# Patient Record
Sex: Female | Born: 1935 | Race: White | Hispanic: No | Marital: Married
Health system: Southern US, Community
[De-identification: ages and names within clinical notes are randomized; demographics above are authoritative.]

---

## 2004-11-30 ENCOUNTER — Ambulatory Visit: Payer: Self-pay | Admitting: Ophthalmology

## 2007-05-06 ENCOUNTER — Ambulatory Visit: Payer: Self-pay | Admitting: Internal Medicine

## 2007-05-12 ENCOUNTER — Ambulatory Visit: Payer: Self-pay | Admitting: Internal Medicine

## 2007-06-11 ENCOUNTER — Ambulatory Visit: Payer: Self-pay | Admitting: Internal Medicine

## 2010-03-16 ENCOUNTER — Ambulatory Visit: Payer: Self-pay | Admitting: Internal Medicine

## 2013-01-02 ENCOUNTER — Inpatient Hospital Stay: Payer: Self-pay | Admitting: Internal Medicine

## 2013-01-02 LAB — COMPREHENSIVE METABOLIC PANEL
Albumin: 3.6 g/dL (ref 3.4–5.0)
Alkaline Phosphatase: 96 U/L (ref 50–136)
Anion Gap: 11 (ref 7–16)
BUN: 26 mg/dL — ABNORMAL HIGH (ref 7–18)
Bilirubin,Total: 0.4 mg/dL (ref 0.2–1.0)
Calcium, Total: 9.2 mg/dL (ref 8.5–10.1)
Co2: 24 mmol/L (ref 21–32)
Creatinine: 1.55 mg/dL — ABNORMAL HIGH (ref 0.60–1.30)
EGFR (African American): 33 — ABNORMAL LOW
EGFR (Non-African Amer.): 28 — ABNORMAL LOW
Glucose: 163 mg/dL — ABNORMAL HIGH (ref 65–99)
Osmolality: 288 (ref 275–301)
Potassium: 3.6 mmol/L (ref 3.5–5.1)
SGOT(AST): 22 U/L (ref 15–37)
SGPT (ALT): 18 U/L (ref 12–78)
Sodium: 140 mmol/L (ref 136–145)
Total Protein: 7.3 g/dL (ref 6.4–8.2)

## 2013-01-02 LAB — URINALYSIS, COMPLETE
Bilirubin,UR: NEGATIVE
Blood: NEGATIVE
Glucose,UR: NEGATIVE mg/dL (ref 0–75)
Ketone: NEGATIVE
Leukocyte Esterase: NEGATIVE
Ph: 5 (ref 4.5–8.0)
RBC,UR: 3 /HPF (ref 0–5)
Specific Gravity: 1.021 (ref 1.003–1.030)

## 2013-01-02 LAB — CBC
HGB: 14.1 g/dL (ref 12.0–16.0)
MCH: 28.2 pg (ref 26.0–34.0)
MCHC: 32.2 g/dL (ref 32.0–36.0)
RBC: 5 10*6/uL (ref 3.80–5.20)

## 2013-01-02 LAB — LIPASE, BLOOD: Lipase: 184 U/L (ref 73–393)

## 2013-01-03 LAB — CBC WITH DIFFERENTIAL/PLATELET
Basophil %: 0.4 %
Eosinophil #: 0.1 10*3/uL (ref 0.0–0.7)
Eosinophil %: 0.5 %
HCT: 32.6 % — ABNORMAL LOW (ref 35.0–47.0)
HGB: 10.6 g/dL — ABNORMAL LOW (ref 12.0–16.0)
MCHC: 32.4 g/dL (ref 32.0–36.0)
Neutrophil #: 10.1 10*3/uL — ABNORMAL HIGH (ref 1.4–6.5)

## 2013-01-04 LAB — BASIC METABOLIC PANEL
Anion Gap: 9 (ref 7–16)
BUN: 18 mg/dL (ref 7–18)
Calcium, Total: 7.8 mg/dL — ABNORMAL LOW (ref 8.5–10.1)
Creatinine: 1.51 mg/dL — ABNORMAL HIGH (ref 0.60–1.30)
EGFR (African American): 34 — ABNORMAL LOW
Osmolality: 286 (ref 275–301)
Potassium: 3.5 mmol/L (ref 3.5–5.1)

## 2013-01-04 LAB — CBC WITH DIFFERENTIAL/PLATELET
Eosinophil #: 0.2 10*3/uL (ref 0.0–0.7)
Eosinophil %: 2.1 %
HGB: 9.1 g/dL — ABNORMAL LOW (ref 12.0–16.0)
Lymphocyte #: 1.1 10*3/uL (ref 1.0–3.6)
Lymphocyte %: 13.7 %
MCH: 28.8 pg (ref 26.0–34.0)
MCV: 87 fL (ref 80–100)
Monocyte #: 0.6 x10 3/mm (ref 0.2–0.9)
Monocyte %: 7.8 %
Neutrophil #: 6 10*3/uL (ref 1.4–6.5)
Platelet: 165 10*3/uL (ref 150–440)
RBC: 3.17 10*6/uL — ABNORMAL LOW (ref 3.80–5.20)
RDW: 16.8 % — ABNORMAL HIGH (ref 11.5–14.5)
WBC: 7.9 10*3/uL (ref 3.6–11.0)

## 2014-06-28 IMAGING — CR DG ABDOMEN 3V
1 series · 4 of 4 positions shown · non-contrast
Comparison: none

REASON FOR EXAM: Pain
COMMENTS:   May transport without cardiac monitor

PROCEDURE:     DXR - DXR ABDOMEN 3-WAY (INCL PA CXR)  - January 02, 2013  [DATE]
RESULT:

[Series 1: x chest ap · 0.14mm/px · 4 of 4 slices shown]
[im 1/4]
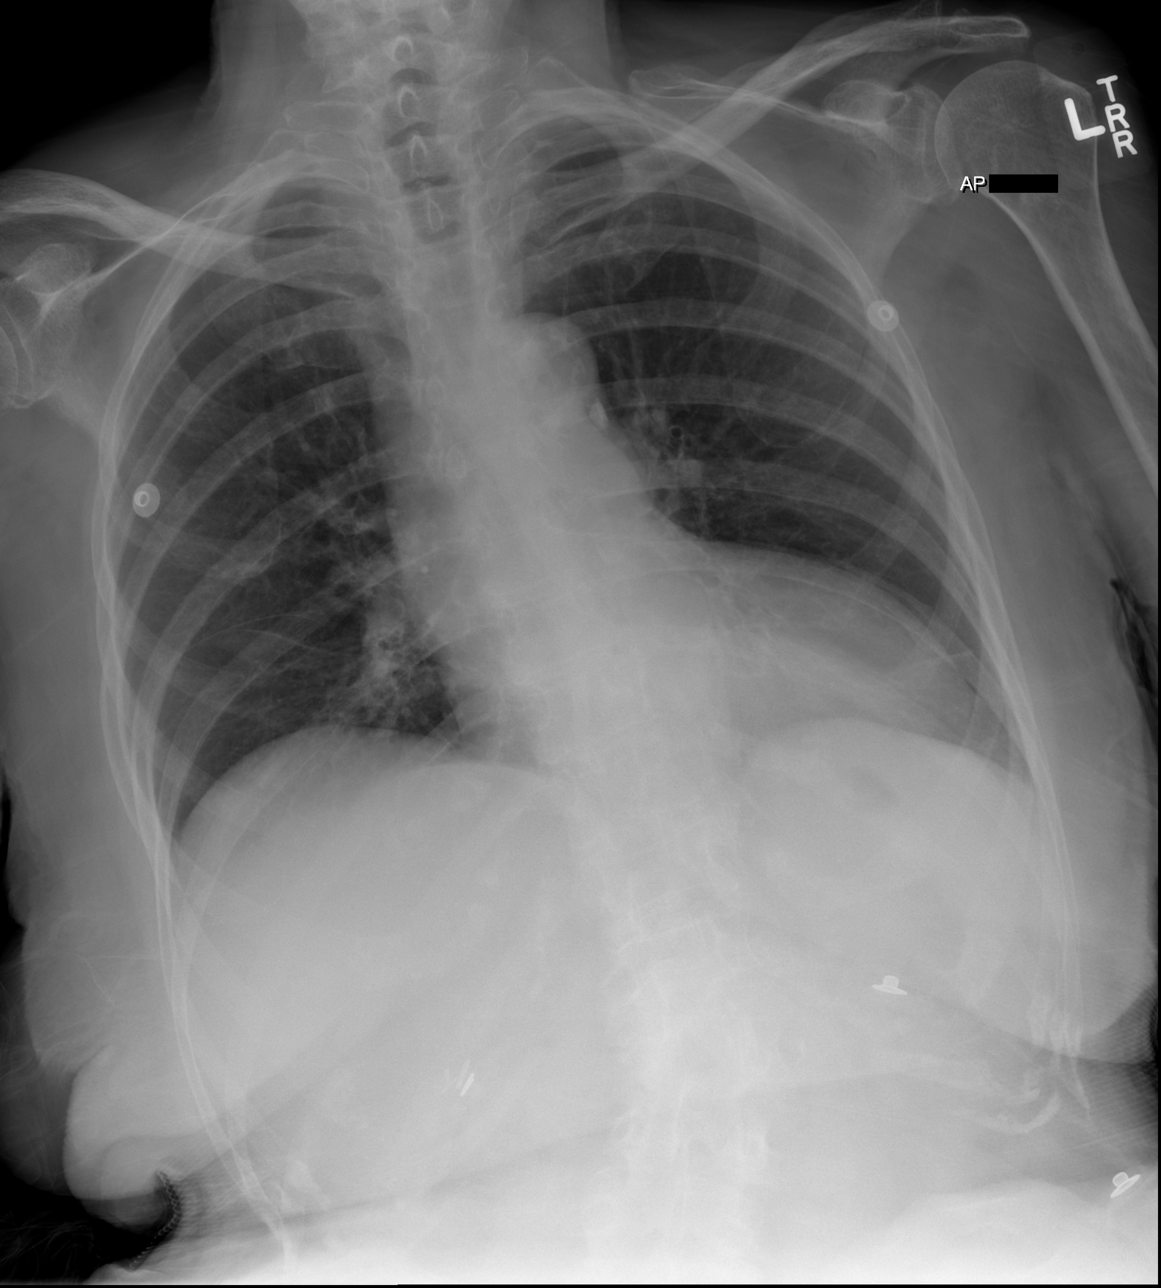
[im 2/4]
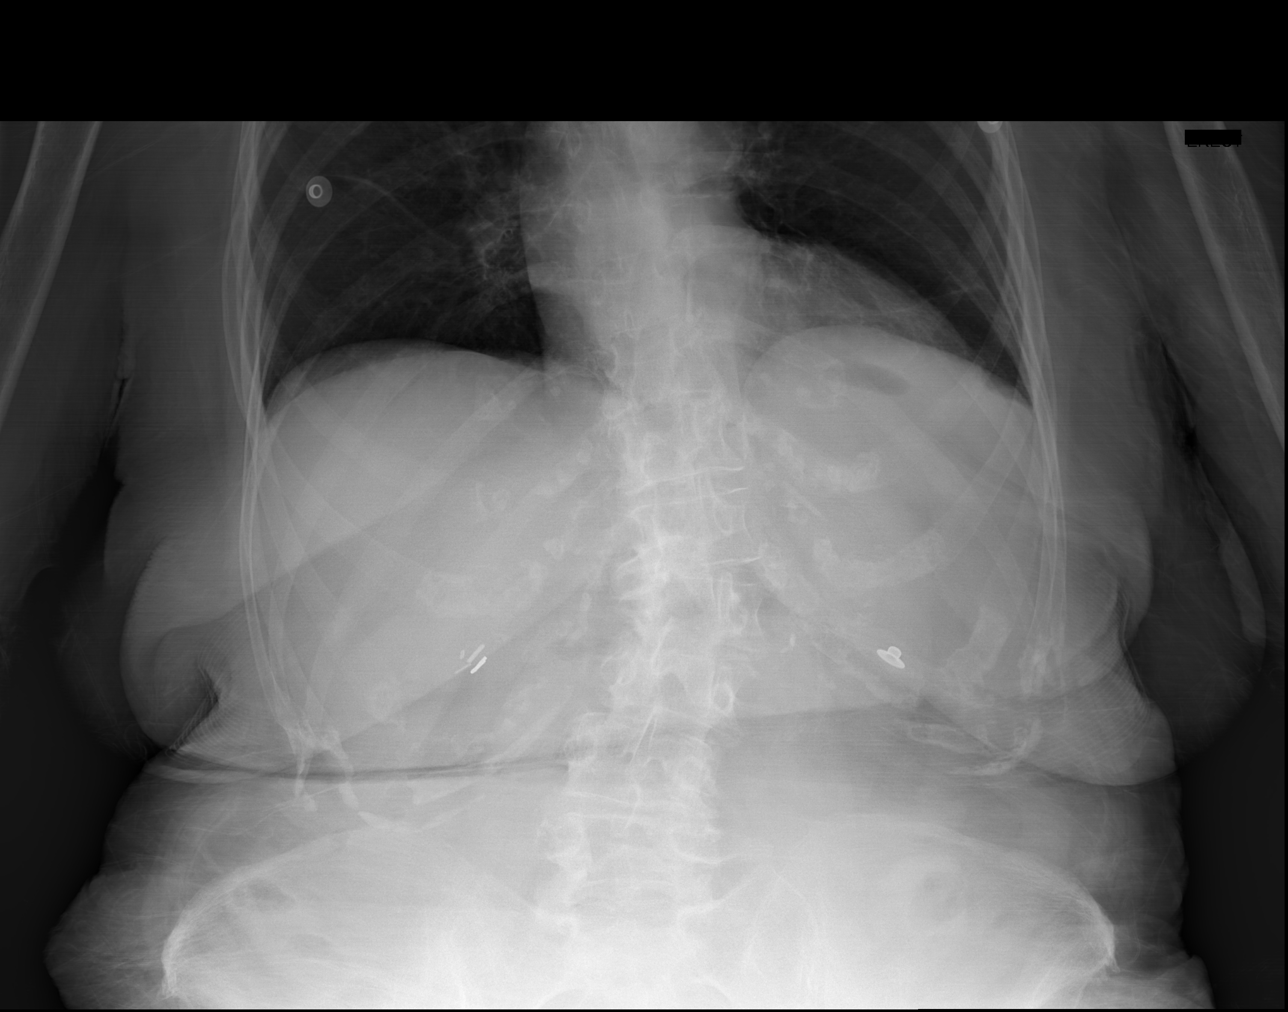
[im 3/4]
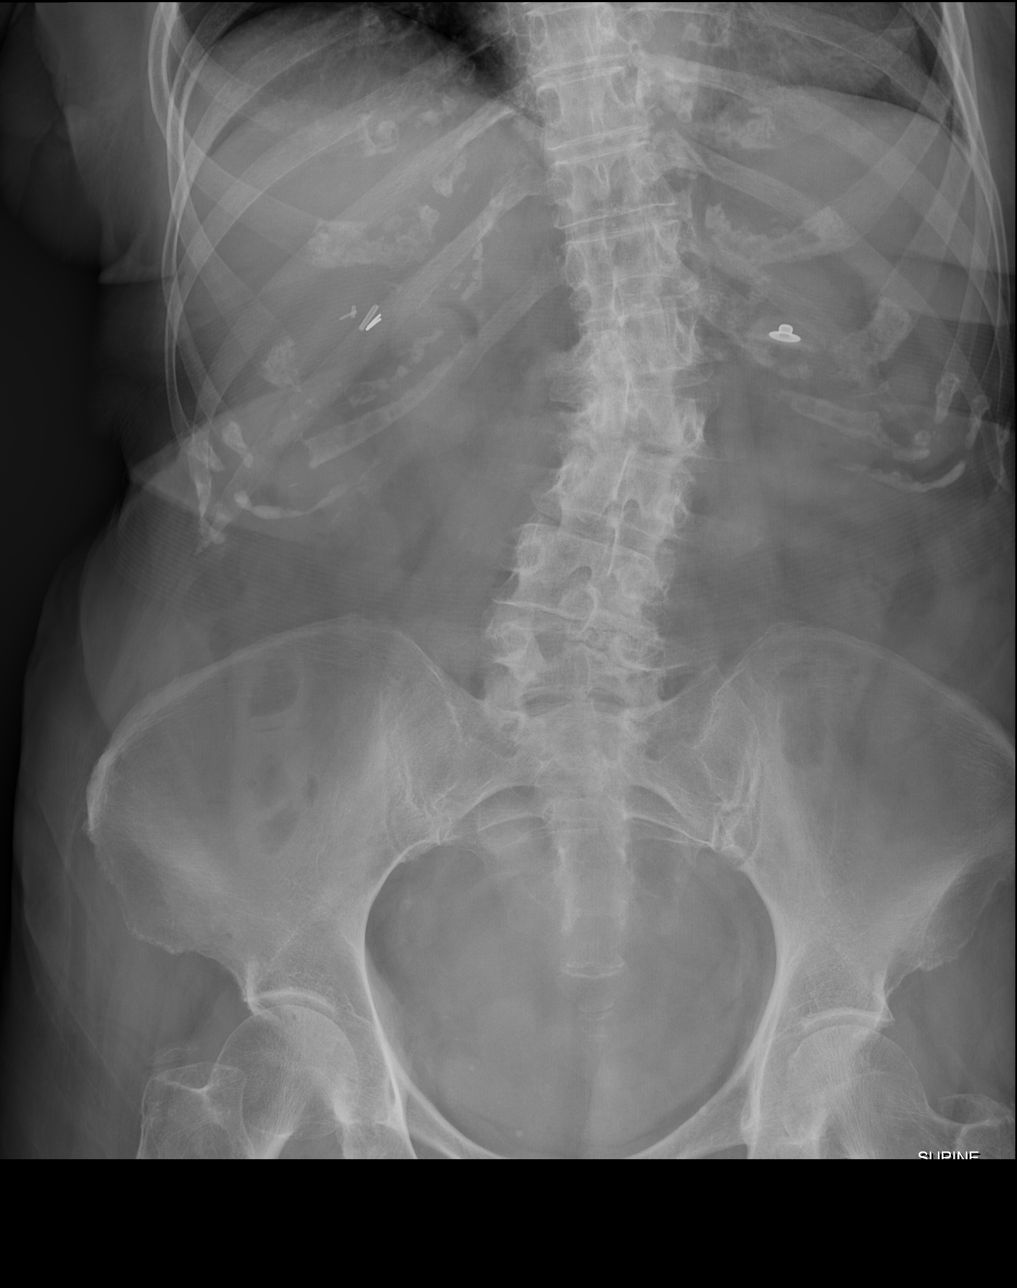
[im 4/4]
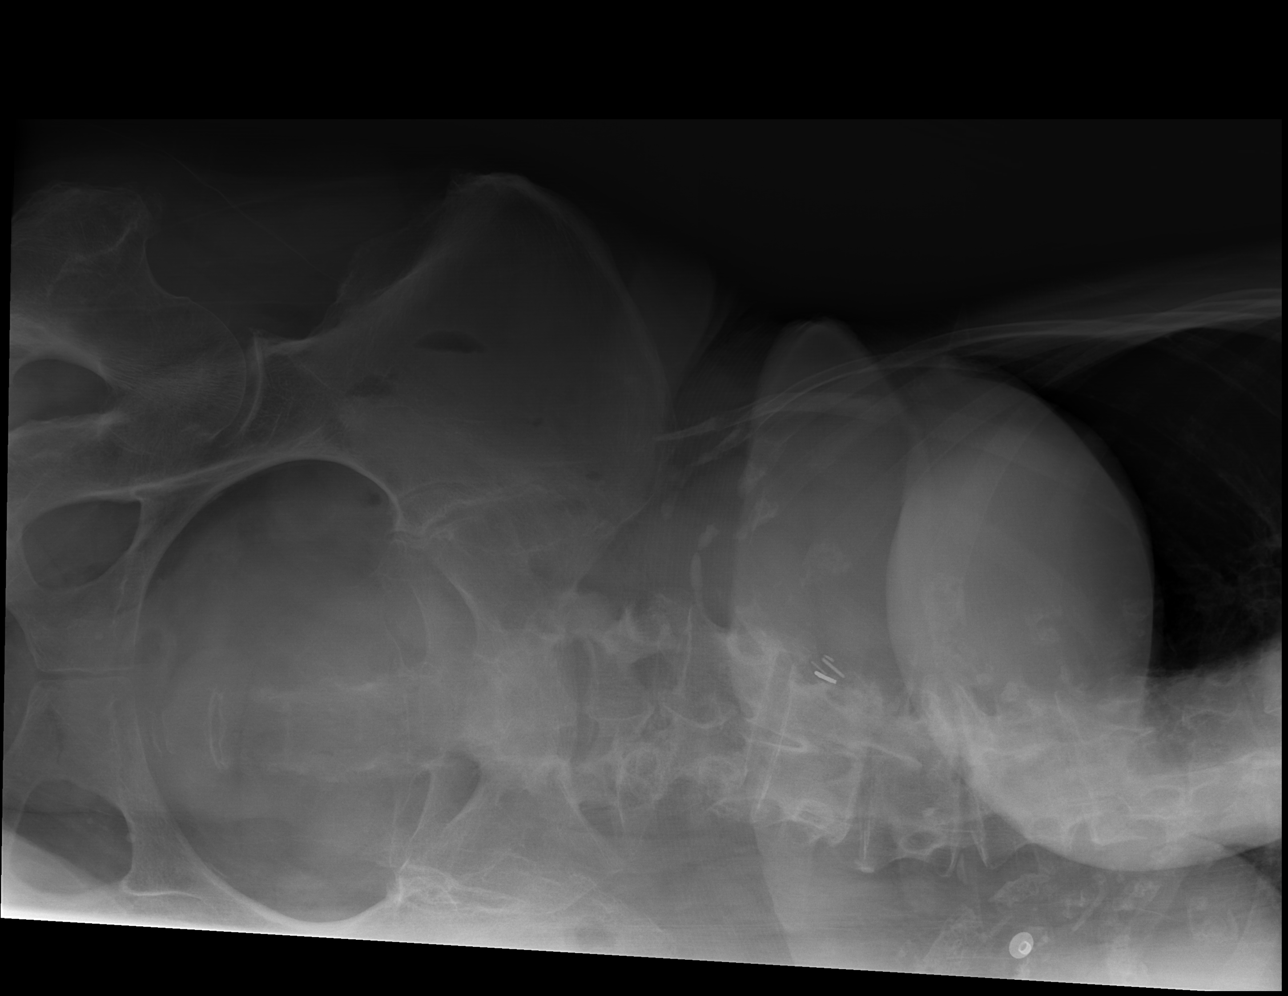

[4 of 4 positions shown; findings below may reference images not displayed]

FINDINGS: Frontal view of the chest demonstrates no evidence of focal
infiltrates, effusions or edema. S-shaped scoliosis is identified within the
thoracolumbar spine.

There is a paucity of bowel gas. There is no evidence of free air.
IMPRESSION: 1.  Nonspecific, nonobstructive bowel gas pattern.
2.  Chest radiograph without evidence of acute cardiopulmonary disease.

## 2014-06-28 IMAGING — CT CT ABD-PELV W/O CM
1 of 2 series · 15 of 32 positions shown, 19 images · non-contrast
Comparison: none

REASON FOR EXAM: (1) vomiting, abdominal pain, leukocytosis; (2)
vomiting, abdominal pain, leukoc
COMMENTS:

PROCEDURE:     CT  - CT ABDOMEN AND PELVIS W[DATE]  [DATE]
RESULT:     Comparison: None
TECHNIQUE: Multiple axial images from the lung bases to the symphysis pubis
were obtained without oral and without intravenous contrast.

[Series 2: 3mm soft tissue · axial · 0.68mm/px · z∈[-1132,-742]mm · 15 of 142 slices shown, 19 images]
[im 6/142  soft-tissue]
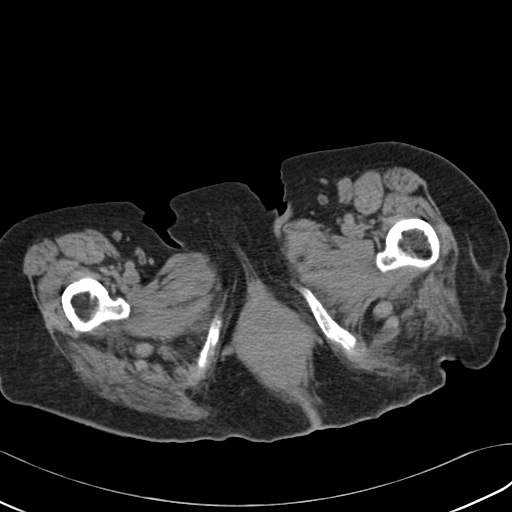
[im 6/142  bone]
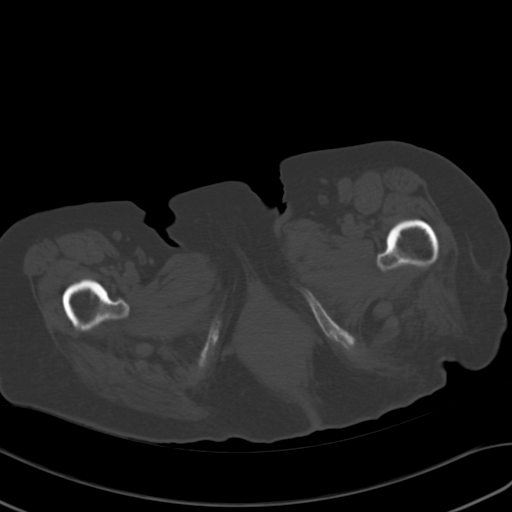
[im 18/142  soft-tissue]
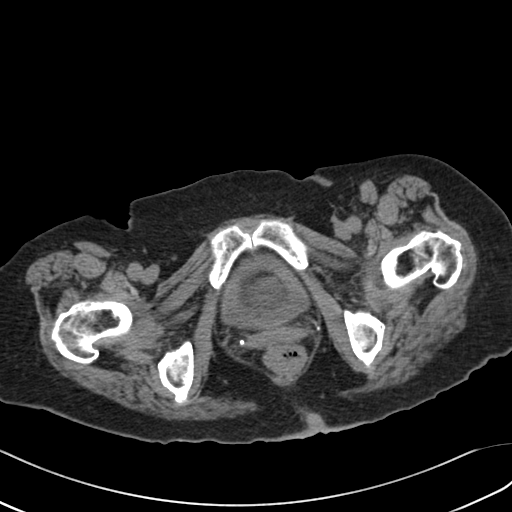
[im 30/142  soft-tissue]
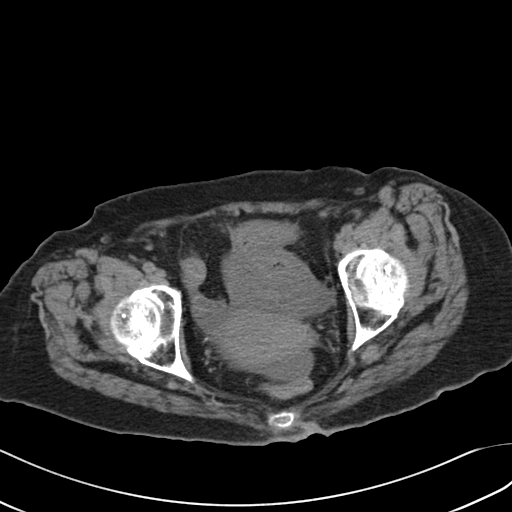
[im 42/142  soft-tissue]
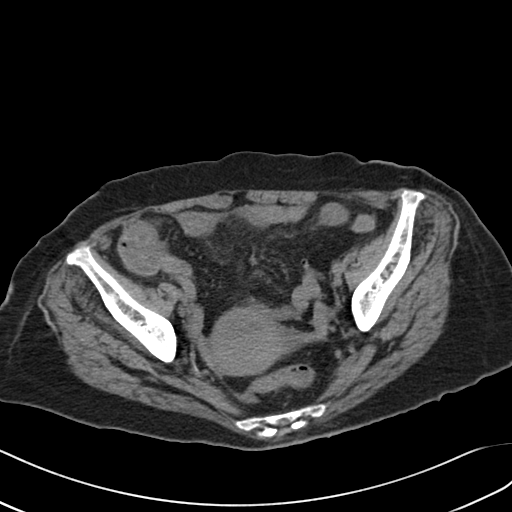
[im 48/142  soft-tissue]
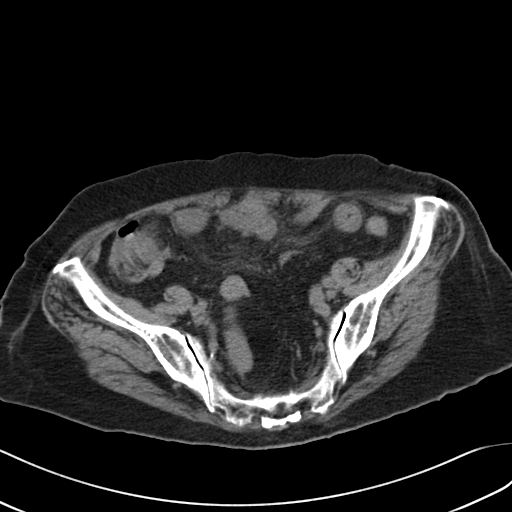
[im 59/142  soft-tissue]
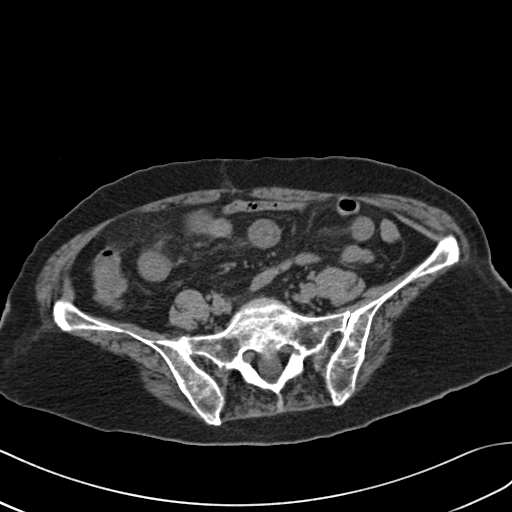
[im 71/142  soft-tissue]
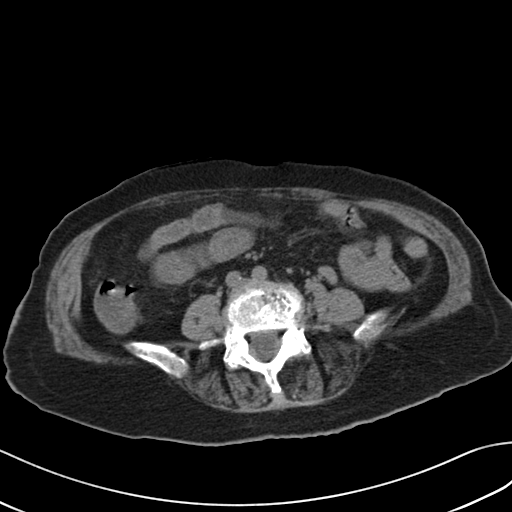
[im 83/142  soft-tissue]
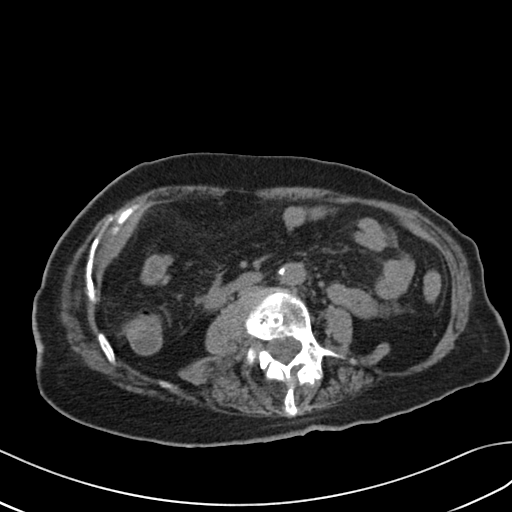
[im 95/142  soft-tissue]
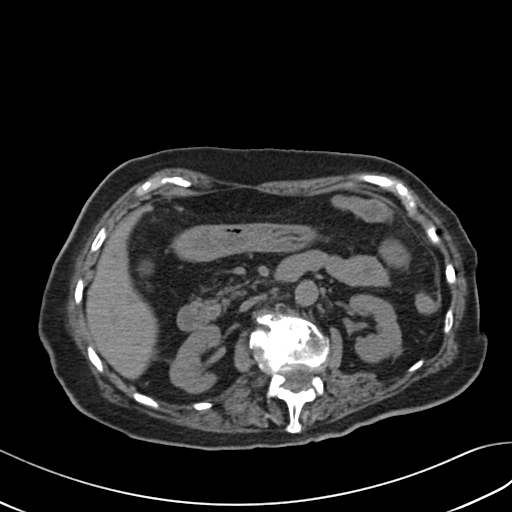
[im 95/142  bone]
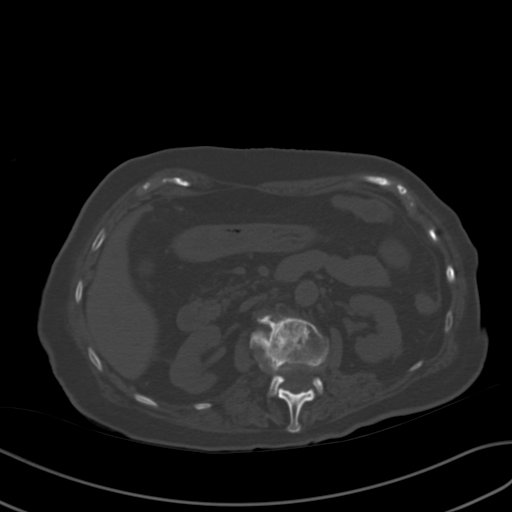
[im 100/142  soft-tissue]
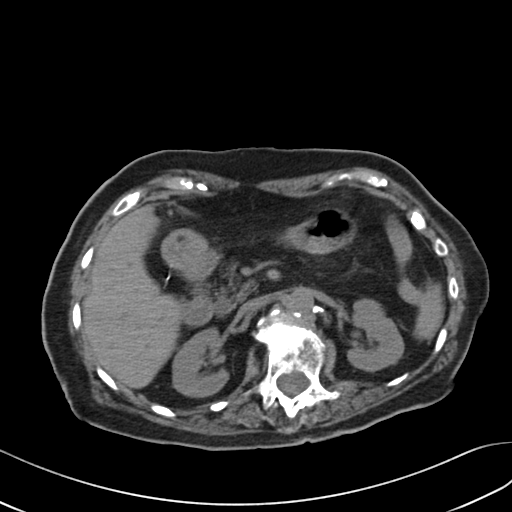
[im 112/142  soft-tissue]
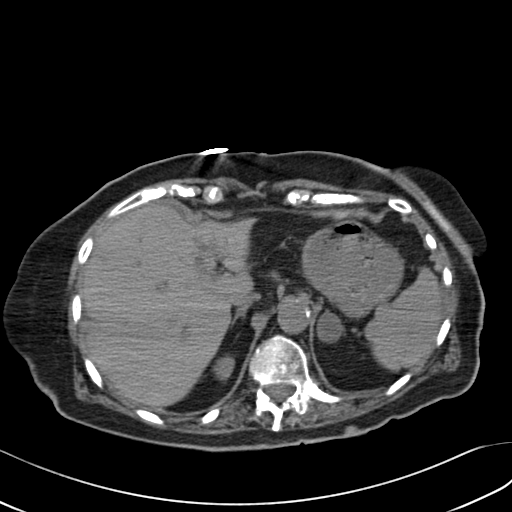
[im 118/142  lung]
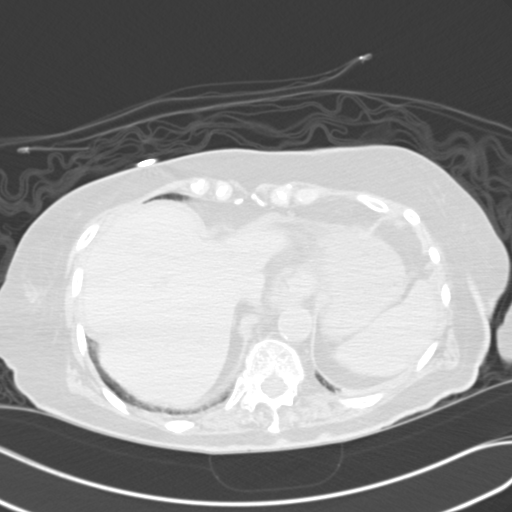
[im 124/142  soft-tissue]
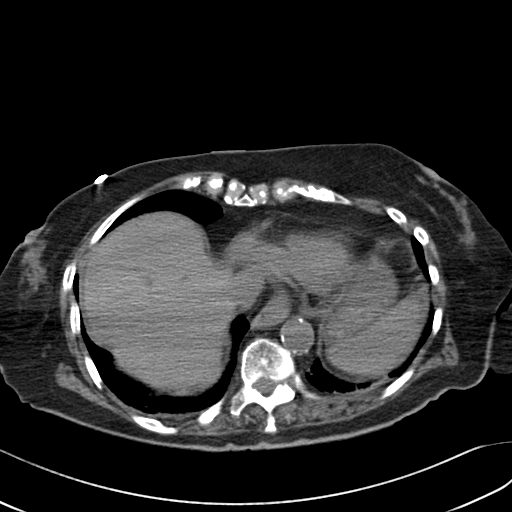
[im 124/142  lung]
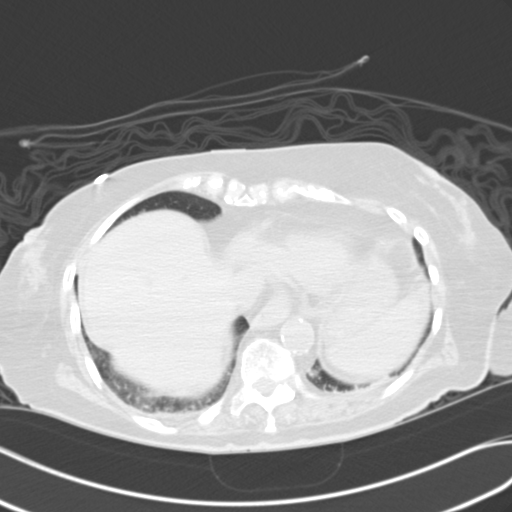
[im 130/142  lung]
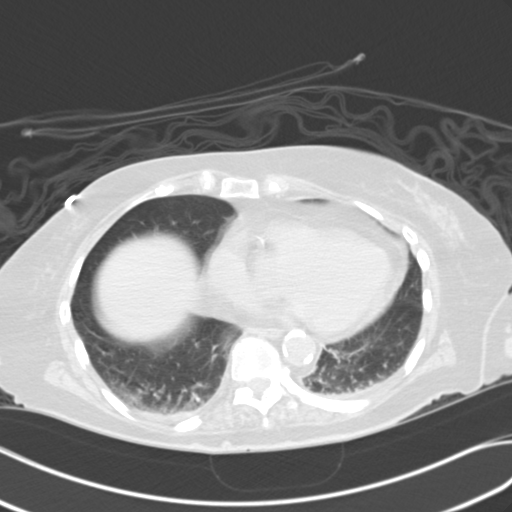
[im 136/142  soft-tissue]
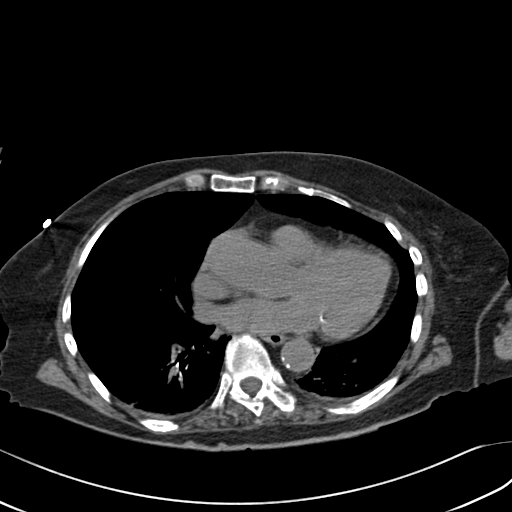
[im 136/142  lung]
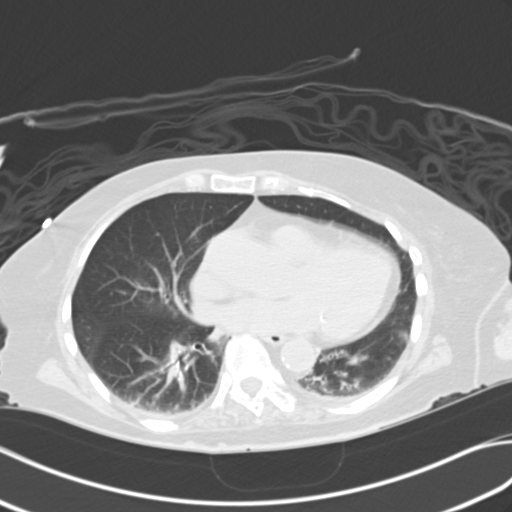

[15 of 32 positions shown; findings below may reference images not displayed]

FINDINGS: Mild basilar opacities are likely secondary to atelectasis.

Lack of intravenous contrast limits evaluation of the solid abdominal
organs.  Grossly, the liver, spleen, right adrenal gland, and pancreas are
unremarkable. There is a small nodule in the left adrenal gland with density
consistent with a cyst. There is a small hiatal hernia. The common bile duct
is mildly dilated, measuring 11 mm in diameter. The patient is status post
cholecystectomy. No renal calculi or hydronephrosis. Small low-attenuation
lesion in the left kidney likely represents a cyst.

The small and large bowel are normal in caliber. There are multiple loops of
distal small bowel in the right hemiabdomen and pelvis which demonstrate
bowel wall thickening. There is adjacent inflammatory stranding. There is a
small amount of free fluid in the pelvis. The appendix is not definitely
visualized. There is some mild fluid at the base of the cecum. However, the
inflammatory changes are felt to be related to the aforementioned small
bowel findings. No free intraperitoneal air.

No aggressive lytic or sclerotic osseous lesions are identified.
IMPRESSION: 1. Multiple loops of small bowel in the right hemiabdomen and pelvis
demonstrate bowel wall thickening and mild adjacent inflammatory changes.
This may be related to infectious, inflammatory, or ischemic enteritis.
2. The appendix is not identified. There is mild inflammatory change at the
base of the cecum, but this is favored to be related to the aforementioned
small bowel findings. However, clinical correlation is recommended.
3. The common bile duct is mildly dilated. This may be secondary to prior
cholecystectomy. However, clinical correlation is recommended.

[REDACTED]

## 2015-04-02 NOTE — H&P (Signed)
PATIENT NAME:  Stefanie Thompson, Stefanie Thompson MR#:  [id number] DATE OF BIRTH:  June 04, 1916  DATE OF ADMISSION:  01/02/2013  PRIMARY CARE PHYSICIAN:  Mickey Farberavid thies MD  CHIEF COMPLAINT: Nausea, vomiting, and abdominal pain.   HISTORY OF PRESENT ILLNESS: The patient is a 81 year old Caucasian female who has advanced dementia, history of hyperlipidemia and hypertension who comes into the Emergency Room accompanied by family members with the complaint of increasing nausea, vomiting, and abdominal pain since yesterday. The patient is unable to keep anything p.o. and has significant diarrheal stools without any blood in it.    In the Emergency Room, the patient was found to have elevated white blood cell count of 22,000 and lactic acid of 1.6. Underwent CT of the abdomen, which showed possible ischemic colitis versus inflammatory bowel disease. The patient is being admitted for further evaluation and management.   In the ER, the patient was started on Cipro. Her IV site infiltratedshe started having a lot of itching and some redness in that arm and Cipro was discontinued. She is going to be getting IV Invanz now.   PAST MEDICAL HISTORY: 1.  History of advanced dementia. . The patient is bedbound and is noncommunicative. She is followed by hospice at home.  2.  Hypertension.  3.  Hyperlipidemia.   FAMILY HISTORY: Positive for hypertension.   SOCIAL HISTORY: Lives at home with her daughter who is a Scientist, forensic24/7 caregiver. Nonsmoker. Not an alcoholic.   CODE STATUS: No code, DO NOT RESUSCITATE. The patient's carries an out of  facility  DO NOT RESUSCITATE form.  REVIEW OF SYSTEMS: Unobtainable due to advanced dementia.   PHYSICAL EXAMINATION: GENERAL: The patient is awake. She is alert, disoriented x 3. Temperature 98.8, pulse 84, blood pressure 113/56, saturation is 93% on room air.  HEENT: Atraumatic, normocephalic. PERRLA, EOMI intact.  Oral mucosa is dry.  NECK: Supple. No JVD. No carotid bruit.  LUNGS: Clear to  auscultation bilaterally. No rales, rhonchi, respiratory distress, or labored breathing.  HEART: Both the heart sounds are normal. Rate and rhythm is regular. PMI not lateralized. Chest is nontender.  EXTREMITIES: Good pedal pulses, good femoral pulses. No lower extremity edema.  ABDOMEN: Soft. There is some tenderness with wincing seen on the patient's facial expression on palpation. No guarding or rigidity. No mass felt.  NEUROLOGIC: The patient is awake. She is alert. She is demented, moaning on and off. Cannot complete neurologic exam; however, grossly nonfocal.  SKIN: Warm and dry.   LABORATORY AND RADIOLOGIC STUDIES: Urine mildly positive for UTI.    White blood cell count is 22,000, hemoglobin and hematocrit 14.1 and 43.7. Lipase 184, glucose 163, BUN 26, creatinine 1.55. The rest of the comprehensive metabolic panel  was within normal limits. Lactic acid 1.6.   CT of the abdomen and pelvis without contrast shows multiple loop of small bowel in the right abdomen and pelvis, demonstrates bowel wall thickening and  inflammatory changes.  This may be related to infectious inflammation or ischemic enteritis.  Appendix is not identified. Common bile duct is mildly dilated secondary to prior cholecystectomy.   ASSESSMENT: A 81 year old patient with history of advanced dementia who is bedbound and is under the care of hospice comes in with:  1.  Acute ischemic/inflammatory colitis. We will admit the patient to the medical floor, keep her n.p.o., continue IV fluids. We will continue IV Invanz, follow blood cultures and follow up CBC. The patient's family wants conservative medical management, hence surgical consultation and gastrointestinal consultations is  not made.  2.  Leukocytosis secondary to acute colitis. We will continue to monitor daily CBCs.  3.  Acute renal failure. Appears (prerenal azotemia in the setting of nausea and vomiting. Continue IV fluids, avoid nephrotoxins. Monitor ins and outs  and monitor creatinine.  4.  Advanced dementia. The patient is bedbound and does not communicate. She is under hospice care at home we will give as-needed Haldol.  5.  Deep vein thrombosis prophylaxis with Lovenox daily.  6.  Further work-up for the patient's clinical course. Hospital admission plan was discussed with the patient's daughter and granddaughter who were present in the Emergency Room.  Granddaughter is Statistician, who works in the  Emergency Room as well.  The patient is a no code, DO NOT RESUSCITATE.   TIME SPENT: 50 minutes    ____________________________ Jearl Klinefelter A. Allena Katz, MD sap:cc D: 01/02/2013 21:52:14 ET T: 01/02/2013 22:13:45 ET JOB#: [id number]  cc: Laurier Jasperson A. Allena Katz, MD, <Dictator> Willow Ora MD ELECTRONICALLY SIGNED 01/03/2013 21:35

## 2015-04-02 NOTE — Discharge Summary (Signed)
PATIENT NAME:  Stefanie Thompson, Stefanie Thompson MR#:  [id number] DATE OF BIRTH:  [birth year]  DATE OF ADMISSION:  01/02/2013 DATE OF DISCHARGE:  01/05/2013  DISCHARGE DIAGNOSES: 1.  Infectious colitis.  2.  Hypertension.  3.  Dementia.  4.  Acute on chronic renal failure CKD stage 3.   DISCHARGE MEDICATIONS: 1.  Lovastatin 20 mg daily.  2.  Sertraline 100 mg p.o. daily.  4.  Melatonin 5 mg p.o. daily.  5.  Klonopin 0.5 mg p.o. t.i.d.  6.  Amlodipine 5 mg daily. 7.  Flagyl 500 mg p.o. t.i.d. for seven days.  8.  Advised to stop benazepril/HCTZ.   DIET: Low sodium, mechanical soft diet.   CONSULTATIONS: None   CODE: DO NOT RESUSCITATE.   HOSPITAL COURSE: The patient is a 81 year old female  with history of severe dementia who is bedbound and taken care of at home by daughters, came in because of abdominal pain, nausea, vomiting, decreased p.o. intake with diarrhea. The patient's lactic acid also elevated at 1.6 on admission. CT of abdomen showed infectious colitis versus ischemia. The patient's family decided not to get any surgery or GI involved. White count admission was elevated at 22.2 and also showed a BUN and creatinine on admission was BUN 26, creatinine 1.55. The patient was admitted for possible infectious colitis and started on Cipro in the ER but the patient developed a rash, so Cipro was discontinued and she was started on Invanz. The patient's abdominal pain, nausea resolved and she tolerated the Invanz. Her white count improved and it came down to 13 on the next day and on the 26th it was 7.9. WBC normalized. The patient's creatinine stayed stable around 1.5, BUN of 18, and GFR of 29. The patient started on diet yesterday. The patient tolerated the diet and did not have nausea, vomiting, or diarrhea. Her white count improved. She was afebrile and the patient received 4 days of IV Invanz, so we changed to Flagyl alone. Discussed with granddaughter and also daughter at bedside. As the patient may also  have chronic kidney disease, stop the HCTZ and lisinopril combination, probable failure, and instead give Norvasc. I do not see any creatinine function for the past 3 years in our computer. The patient's daughter is not aware of any kidney problem for her. The patient's creatinine did not improve despite IV hydration in the hospital, so I am assuming that she is having some CKD stage 3 as well with long-standing hypertension. She is supposed to get blood work with the home hospice probably in a week just to make sure the creatinine is not rising. The patient's condition is discussed with the daughter. The patient is usually bedridden and does not walk at all,  so we discharged her home under the care of patient's daughters.  TIME SPENT ON DISCHARGE PREPARATION:  30 minutes.    ____________________________ Katha HammingSnehalatha Serria Sloma, MD sk:cc D: 01/05/2013 22:36:32 ET T: 01/05/2013 23:22:10 ET JOB#: [id number]  cc: Katha HammingSnehalatha Jen Eppinger, MD, <Dictator> Katha HammingSNEHALATHA Rosaleigh Brazzel MD ELECTRONICALLY SIGNED 01/19/2013 20:54

## 2016-03-11 DEATH — deceased
